# Patient Record
Sex: Male | Born: 1980 | Race: Black or African American | Hispanic: No | Smoking: Current some day smoker
Health system: Southern US, Community
[De-identification: ages and names within clinical notes are randomized; demographics above are authoritative.]

## PROBLEM LIST (undated history)

## (undated) DIAGNOSIS — S83282A Other tear of lateral meniscus, current injury, left knee, initial encounter: Secondary | ICD-10-CM

## (undated) DIAGNOSIS — I10 Essential (primary) hypertension: Secondary | ICD-10-CM

## (undated) DIAGNOSIS — F32A Depression, unspecified: Secondary | ICD-10-CM

## (undated) DIAGNOSIS — I209 Angina pectoris, unspecified: Secondary | ICD-10-CM

## (undated) DIAGNOSIS — F329 Major depressive disorder, single episode, unspecified: Secondary | ICD-10-CM

## (undated) DIAGNOSIS — F419 Anxiety disorder, unspecified: Secondary | ICD-10-CM

## (undated) HISTORY — DX: Anxiety disorder, unspecified: F41.9

## (undated) HISTORY — DX: Depression, unspecified: F32.A

---
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
## 2013-09-20 HISTORY — PX: WRIST SURGERY: SHX841

## 2015-11-25 ENCOUNTER — Ambulatory Visit
Admission: RE | Admit: 2015-11-25 | Discharge: 2015-11-25 | Disposition: A | Discharge status: Home / Self Care | Source: Ambulatory Visit | Attending: Family Medicine | Admitting: Family Medicine

## 2015-11-25 ENCOUNTER — Other Ambulatory Visit: Payer: Self-pay | Admitting: Family Medicine

## 2015-11-25 DIAGNOSIS — M545 Low back pain: Secondary | ICD-10-CM

## 2016-06-09 ENCOUNTER — Ambulatory Visit
Admission: RE | Admit: 2016-06-09 | Discharge: 2016-06-09 | Disposition: A | Discharge status: Home / Self Care | Source: Ambulatory Visit | Attending: Family Medicine | Admitting: Family Medicine

## 2016-06-09 ENCOUNTER — Other Ambulatory Visit: Payer: Self-pay | Admitting: Family Medicine

## 2016-06-09 DIAGNOSIS — M25571 Pain in right ankle and joints of right foot: Secondary | ICD-10-CM

## 2016-06-09 DIAGNOSIS — M25561 Pain in right knee: Secondary | ICD-10-CM

## 2016-06-09 DIAGNOSIS — T1490XA Injury, unspecified, initial encounter: Secondary | ICD-10-CM

## 2019-07-27 ENCOUNTER — Other Ambulatory Visit: Payer: Self-pay

## 2019-07-27 ENCOUNTER — Encounter: Payer: Self-pay | Admitting: Cardiology

## 2019-07-27 ENCOUNTER — Ambulatory Visit (INDEPENDENT_AMBULATORY_CARE_PROVIDER_SITE_OTHER): Admitting: Cardiology

## 2019-07-27 VITALS — BP 141/93 | HR 58 | Temp 97.0°F | Ht 74.0 in | Wt 258.0 lb

## 2019-07-27 DIAGNOSIS — Z8249 Family history of ischemic heart disease and other diseases of the circulatory system: Secondary | ICD-10-CM | POA: Diagnosis not present

## 2019-07-27 DIAGNOSIS — R030 Elevated blood-pressure reading, without diagnosis of hypertension: Secondary | ICD-10-CM

## 2019-07-27 DIAGNOSIS — I209 Angina pectoris, unspecified: Secondary | ICD-10-CM | POA: Diagnosis not present

## 2019-07-27 MED ORDER — ROSUVASTATIN CALCIUM 10 MG PO TABS: 10.0000 mg | ORAL_TABLET | Freq: Every day | ORAL | 1 refills | Status: DC

## 2019-07-27 MED ORDER — NITROGLYCERIN 0.4 MG SL SUBL: 0.4000 mg | SUBLINGUAL_TABLET | SUBLINGUAL | 1 refills | Status: DC | PRN

## 2019-07-27 MED ORDER — AMLODIPINE BESYLATE 5 MG PO TABS: 5.0000 mg | ORAL_TABLET | Freq: Every day | ORAL | 2 refills | Status: DC

## 2019-07-27 NOTE — Progress Notes (Signed)
Primary Physician/Referring:  Lin Landsman, MD  Patient ID: Nathan Rosales, male    DOB: Nov 17, 1980, 38 y.o.   MRN: [id number]  Chief Complaint  Patient presents with  . Chest Pain  . Tachycardia  . New Patient (Initial Visit)   HPI:    HPI: Nathan Rosales  is a 38 y.o. with anxiety/depression, family history of heart disease in his father who had CABG x 3 in his late 15's, referred to [place] by Dr. Ayesha Rumpf for evaluation of chest pain, shortness of breath, and dizziness that occurred during intercourse.   He reports that about a month ago, during sex, he suddenly developed chest heaviness and near syncope. States that the episode made him drop to his knees, but he did not actually pass out. He has not had recurrence; however, he has not been pushing himself. He did have some slight chest heaviness while on the elliptical 1 day ago that improved with resting.   No history of hypertension and hyperlipidemia.  Smokes 2 cigars a week. Drinks alcohol socially. No illicit drug use. He is a former Magazine features editor, and now works as a Human resources officer.   Past Medical History:  Diagnosis Date  . Anxiety   . Depression    Past Surgical History:  Procedure Laterality Date  . WRIST SURGERY  2015   Social History   Socioeconomic History  . Marital status: Divorced    Spouse name: Not on file  . Number of children: 2  . Years of education: Not on file  . Highest education level: Not on file  Occupational History  . Not on file  Social Needs  . Financial resource strain: Not on file  . Food insecurity    Worry: Not on file    Inability: Not on file  . Transportation needs    Medical: Not on file    Non-medical: Not on file  Tobacco Use  . Smoking status: Current Some Day Smoker  . Smokeless tobacco: Never Used  Substance and Sexual Activity  . Alcohol use: Yes  . Drug use: Never  . Sexual activity: Not on file  Lifestyle  . Physical activity    Days per week: Not on file    Minutes per  session: Not on file  . Stress: Not on file  Relationships  . Social Herbalist on phone: Not on file    Gets together: Not on file    Attends religious service: Not on file    Active member of club or organization: Not on file    Attends meetings of clubs or organizations: Not on file    Relationship status: Not on file  . Intimate partner violence    Fear of current or ex partner: Not on file    Emotionally abused: Not on file    Physically abused: Not on file    Forced sexual activity: Not on file  Other Topics Concern  . Not on file  Social History Narrative  . Not on file   ROS  Review of Systems  Constitution: Negative for decreased appetite, malaise/fatigue, weight gain and weight loss.  Eyes: Negative for visual disturbance.  Cardiovascular: Positive for chest pain. Negative for claudication, dyspnea on exertion, leg swelling, orthopnea, palpitations and syncope.  Respiratory: Negative for hemoptysis and wheezing.   Endocrine: Negative for cold intolerance and heat intolerance.  Hematologic/Lymphatic: Does not bruise/bleed easily.  Skin: Negative for nail changes.  Musculoskeletal: Negative for muscle weakness and myalgias.  Gastrointestinal: Negative for abdominal pain, change in bowel habit, nausea and vomiting.  Neurological: Negative for difficulty with concentration, dizziness, focal weakness and headaches.  Psychiatric/Behavioral: Negative for altered mental status and suicidal ideas.  All other systems reviewed and are negative.  Objective  Blood pressure (!) 141/93, pulse (!) 58, temperature (!) 97 F (36.1 C), height 6\' 2"  (1.88 m), weight 258 lb (117 kg), SpO2 97 %. Body mass index is 33.13 kg/m.   Physical Exam  Constitutional: He is oriented to person, place, and time. Vital signs are normal. He appears well-developed and well-nourished.  HENT:  Head: Normocephalic and atraumatic.  Neck: Normal range of motion.  Cardiovascular: Normal rate,  regular rhythm, normal heart sounds and intact distal pulses.  Pulmonary/Chest: Effort normal and breath sounds normal. No accessory muscle usage. No respiratory distress.  Abdominal: Soft. Bowel sounds are normal.  Musculoskeletal: Normal range of motion.  Neurological: He is alert and oriented to person, place, and time.  Skin: Skin is warm and dry.  Vitals reviewed.  Radiology: No results found.  Laboratory examination:   No results for input(s): NA, K, CL, CO2, GLUCOSE, BUN, CREATININE, CALCIUM, GFRNONAA, GFRAA in the last 8760 hours. No flowsheet data found. No flowsheet data found. Lipid Panel  No results found for: CHOL, TRIG, HDL, CHOLHDL, VLDL, LDLCALC, LDLDIRECT HEMOGLOBIN A1C No results found for: HGBA1C, MPG TSH No results for input(s): TSH in the last 8760 hours. Medications   Current Outpatient Medications  Medication Instructions  . amLODipine (NORVASC) 5 mg, Oral, Daily  . escitalopram (LEXAPRO) 10 MG tablet 1 tablet, Oral, Daily PRN  . ibuprofen (ADVIL) 800 MG tablet 1 tablet, Oral, As needed  . nitroGLYCERIN (NITROSTAT) 0.4 mg, Sublingual, Every 5 min PRN  . rosuvastatin (CRESTOR) 10 mg, Oral, Daily  . zolpidem (AMBIEN) 10 MG tablet 1 tablet, Oral, Daily    Cardiac Studies:     Assessment     ICD-10-CM   1. Angina pectoris (HCC)  I20.9 EKG 12-Lead    Comprehensive Metabolic Panel (CMET)    CBC    Lipid Profile    PCV ECHOCARDIOGRAM COMPLETE  2. Elevated blood-pressure reading without diagnosis of hypertension  R03.0 PCV ECHOCARDIOGRAM COMPLETE  3. Family history of heart disease  Z82.49   4. Chest pain due to myocardial ischemia, unspecified ischemic chest pain type  I25.9 CT COROARY FRACTIONAL FLOW RESERVE (FFR)    CT CORONARY FRACTIONAL FLOW RESERVE FLUID ANALYSIS    CT CORONARY MORPH W/CTA COR W/SCORE W/CA W/CM &/OR WO/CM    EKG 07/27/2019: Normal sinus rhythm at 64 bpm, normal axis, PRWP cannot exclude anterior infarct old. No evidence of  ischemia.  Recommendations:   Patient symptoms are concerning for angina.  He has family history of heart disease.  Will obtain coronary CTA for further evaluation.  His blood pressure is also elevated today, but is without history of hypertension.  Will obtain echocardiogram to exclude any structural abnormalities.  He has not had any recent labs, will obtain CMP, CBC, and lipids.  We will start Crestor 10 mg daily and make further recommendations depending upon his lipid panel results.  We will also start him on amlodipine 5 mg daily given his symptoms of angina and elevated blood pressure.  I have also given him prescription for nitroglycerin and advised how to use in case he has any recurrence of chest pain.  Encouraged him to take it easy until he can have further evaluation.  We will see him back  after the test for further recommendations and reevaluation.   *I have discussed this case with Dr. Jacinto HalimGanji and he personally examined the patient and participated in formulating the plan.*  Toniann FailAshton Haynes Kelley, MSN, APRN, FNP-C [facility] Office: [phone] Fax: [phone]

## 2019-07-31 LAB — CBC
Hematocrit: 45.4 % (ref 37.5–51.0)
Hemoglobin: 15.4 g/dL (ref 13.0–17.7)
MCH: 28.5 pg (ref 26.6–33.0)
MCHC: 33.9 g/dL (ref 31.5–35.7)
MCV: 84 fL (ref 79–97)
Platelets: 192 10*3/uL (ref 150–450)
RBC: 5.41 x10E6/uL (ref 4.14–5.80)
RDW: 13.8 % (ref 11.6–15.4)
WBC: 3.7 10*3/uL (ref 3.4–10.8)

## 2019-07-31 LAB — LIPID PANEL
Chol/HDL Ratio: 3.8 ratio (ref 0.0–5.0)
Cholesterol, Total: 210 mg/dL — ABNORMAL HIGH (ref 100–199)
HDL: 56 mg/dL (ref >39)
LDL Chol Calc (NIH): 138 mg/dL — ABNORMAL HIGH (ref 0–99)
Triglycerides: 92 mg/dL (ref 0–149)
VLDL Cholesterol Cal: 16 mg/dL (ref 5–40)

## 2019-07-31 LAB — COMPREHENSIVE METABOLIC PANEL
ALT: 23 IU/L (ref 0–44)
AST: 20 IU/L (ref 0–40)
Albumin/Globulin Ratio: 1.9 (ref 1.2–2.2)
Albumin: 4.5 g/dL (ref 4.0–5.0)
Alkaline Phosphatase: 60 IU/L (ref 39–117)
BUN/Creatinine Ratio: 6 — ABNORMAL LOW (ref 9–20)
BUN: 7 mg/dL (ref 6–20)
Bilirubin Total: 0.4 mg/dL (ref 0.0–1.2)
CO2: 23 mmol/L (ref 20–29)
Calcium: 9.4 mg/dL (ref 8.7–10.2)
Chloride: 104 mmol/L (ref 96–106)
Creatinine, Ser: 1.24 mg/dL (ref 0.76–1.27)
GFR calc Af Amer: 84 mL/min/{1.73_m2} (ref >59)
GFR calc non Af Amer: 73 mL/min/{1.73_m2} (ref >59)
Globulin, Total: 2.4 g/dL (ref 1.5–4.5)
Glucose: 92 mg/dL (ref 65–99)
Potassium: 4.6 mmol/L (ref 3.5–5.2)
Sodium: 139 mmol/L (ref 134–144)
Total Protein: 6.9 g/dL (ref 6.0–8.5)

## 2019-08-01 ENCOUNTER — Other Ambulatory Visit: Payer: Self-pay

## 2019-08-01 ENCOUNTER — Ambulatory Visit (INDEPENDENT_AMBULATORY_CARE_PROVIDER_SITE_OTHER)

## 2019-08-01 DIAGNOSIS — I209 Angina pectoris, unspecified: Secondary | ICD-10-CM | POA: Diagnosis not present

## 2019-08-01 DIAGNOSIS — R030 Elevated blood-pressure reading, without diagnosis of hypertension: Secondary | ICD-10-CM

## 2019-08-13 ENCOUNTER — Other Ambulatory Visit: Payer: Self-pay

## 2019-08-13 DIAGNOSIS — I209 Angina pectoris, unspecified: Secondary | ICD-10-CM

## 2019-08-13 MED ORDER — NITROGLYCERIN 0.4 MG SL SUBL: 0.4000 mg | SUBLINGUAL_TABLET | SUBLINGUAL | 1 refills | Status: DC | PRN

## 2019-08-23 ENCOUNTER — Encounter: Payer: Self-pay | Admitting: Cardiology

## 2019-08-23 ENCOUNTER — Other Ambulatory Visit: Payer: Self-pay

## 2019-08-23 ENCOUNTER — Ambulatory Visit (INDEPENDENT_AMBULATORY_CARE_PROVIDER_SITE_OTHER): Admitting: Cardiology

## 2019-08-23 VITALS — BP 128/96 | HR 72 | Ht 74.0 in | Wt 264.0 lb

## 2019-08-23 DIAGNOSIS — I208 Other forms of angina pectoris: Secondary | ICD-10-CM | POA: Diagnosis not present

## 2019-08-23 DIAGNOSIS — Z8249 Family history of ischemic heart disease and other diseases of the circulatory system: Secondary | ICD-10-CM

## 2019-08-23 DIAGNOSIS — E782 Mixed hyperlipidemia: Secondary | ICD-10-CM

## 2019-08-23 DIAGNOSIS — I10 Essential (primary) hypertension: Secondary | ICD-10-CM

## 2019-08-23 MED ORDER — METOPROLOL TARTRATE 25 MG PO TABS: 25.0000 mg | ORAL_TABLET | Freq: Two times a day (BID) | ORAL | 2 refills | Status: DC

## 2019-08-23 NOTE — Progress Notes (Signed)
Primary Physician/Referring:  Leilani Ableeese, Betti, MD  Patient ID: Nathan Rosales, male    DOB: [birth year], 38 y.o.   MRN: [id number]  Chief Complaint  Patient presents with  . Chest Pain    test results   . Follow-up   HPI:    HPI: Nathan Rosales  is a 38 y.o. with anxiety/depression, family history of heart disease in his father who had CABG x 3 in his late 3750's, recently evaluated by us for concerning symptoms of angina that occurred during intercourse and with exercise, coronary CTA and echocardiogram were ordered now presents for follow-up.  It does not appear that coronary CTA has been performed.  Blood pressure was noted to be elevated at his last office visit, he was started on amlodipine 5 mg.  He was also started on Crestor 10 mg.  Since last seen by me he has not had any further symptoms of chest pain like before; however, has been avoiding strenuous activities to not have any episodes.  He does mention some mild chest discomfort on occasion with stress sitting resting.  He has not used any nitroglycerin.  He is tolerating medications well.  He is very stressed as he reports his cousin who is in his 7750s recently passed of an MI.  No history of hypertension and hyperlipidemia.  Smokes 2 cigars a week. Drinks alcohol socially. No illicit drug use. He is a former Associate Professorcombat medic, and now works as a Corporate investment bankerrecruiter.   Past Medical History:  Diagnosis Date  . Anxiety   . Depression    Past Surgical History:  Procedure Laterality Date  . WRIST SURGERY  2015   Social History   Socioeconomic History  . Marital status: Divorced    Spouse name: Not on file  . Number of children: 2  . Years of education: Not on file  . Highest education level: Not on file  Occupational History  . Not on file  Social Needs  . Financial resource strain: Not on file  . Food insecurity    Worry: Not on file    Inability: Not on file  . Transportation needs    Medical: Not on file    Non-medical: Not on  file  Tobacco Use  . Smoking status: Current Some Day Smoker  . Smokeless tobacco: Never Used  Substance and Sexual Activity  . Alcohol use: Yes  . Drug use: Never  . Sexual activity: Not on file  Lifestyle  . Physical activity    Days per week: Not on file    Minutes per session: Not on file  . Stress: Not on file  Relationships  . Social Musicianconnections    Talks on phone: Not on file    Gets together: Not on file    Attends religious service: Not on file    Active member of club or organization: Not on file    Attends meetings of clubs or organizations: Not on file    Relationship status: Not on file  . Intimate partner violence    Fear of current or ex partner: Not on file    Emotionally abused: Not on file    Physically abused: Not on file    Forced sexual activity: Not on file  Other Topics Concern  . Not on file  Social History Narrative  . Not on file   ROS  Review of Systems  Constitution: Negative for decreased appetite, malaise/fatigue, weight gain and weight loss.  Eyes: Negative for visual disturbance.  Cardiovascular: Positive for chest pain. Negative for claudication, dyspnea on exertion, leg swelling, orthopnea, palpitations and syncope.  Respiratory: Negative for hemoptysis and wheezing.   Endocrine: Negative for cold intolerance and heat intolerance.  Hematologic/Lymphatic: Does not bruise/bleed easily.  Skin: Negative for nail changes.  Musculoskeletal: Negative for muscle weakness and myalgias.  Gastrointestinal: Negative for abdominal pain, change in bowel habit, nausea and vomiting.  Neurological: Negative for difficulty with concentration, dizziness, focal weakness and headaches.  Psychiatric/Behavioral: Negative for altered mental status and suicidal ideas.  All other systems reviewed and are negative.  Objective  Blood pressure (!) 128/96, pulse 72, height 6\' 2"  (1.88 m), weight 264 lb (119.7 kg), SpO2 99 %. Body mass index is 33.9 kg/m.    Physical Exam  Constitutional: He is oriented to person, place, and time. Vital signs are normal. He appears well-developed and well-nourished.  HENT:  Head: Normocephalic and atraumatic.  Neck: Normal range of motion.  Cardiovascular: Normal rate, regular rhythm, normal heart sounds and intact distal pulses.  Pulmonary/Chest: Effort normal and breath sounds normal. No accessory muscle usage. No respiratory distress.  Abdominal: Soft. Bowel sounds are normal.  Musculoskeletal: Normal range of motion.  Neurological: He is alert and oriented to person, place, and time.  Skin: Skin is warm and dry.  Vitals reviewed.  Radiology: No results found.  Laboratory examination:   Recent Labs    07/30/19 1145  NA 139  K 4.6  CL 104  CO2 23  GLUCOSE 92  BUN 7  CREATININE 1.24  CALCIUM 9.4  GFRNONAA 73  GFRAA 84   CMP Latest Ref Rng & Units 07/30/2019  Glucose 65 - 99 mg/dL 92  BUN 6 - 20 mg/dL 7  Creatinine 0.76 - 1.27 mg/dL 1.24  Sodium 134 - 144 mmol/L 139  Potassium 3.5 - 5.2 mmol/L 4.6  Chloride 96 - 106 mmol/L 104  CO2 20 - 29 mmol/L 23  Calcium 8.7 - 10.2 mg/dL 9.4  Total Protein 6.0 - 8.5 g/dL 6.9  Total Bilirubin 0.0 - 1.2 mg/dL 0.4  Alkaline Phos 39 - 117 IU/L 60  AST 0 - 40 IU/L 20  ALT 0 - 44 IU/L 23   CBC Latest Ref Rng & Units 07/30/2019  WBC 3.4 - 10.8 x10E3/uL 3.7  Hemoglobin 13.0 - 17.7 g/dL 15.4  Hematocrit 37.5 - 51.0 % 45.4  Platelets 150 - 450 x10E3/uL 192   Lipid Panel     Component Value Date/Time   CHOL 210 (H) 07/30/2019 1145   TRIG 92 07/30/2019 1145   HDL 56 07/30/2019 1145   CHOLHDL 3.8 07/30/2019 1145   LDLCALC 138 (H) 07/30/2019 1145   HEMOGLOBIN A1C No results found for: HGBA1C, MPG TSH No results for input(s): TSH in the last 8760 hours. Medications   Current Outpatient Medications  Medication Instructions  . amLODipine (NORVASC) 5 mg, Oral, Daily  . escitalopram (LEXAPRO) 10 MG tablet 1 tablet, Oral, Daily PRN  . ibuprofen  (ADVIL) 800 MG tablet 1 tablet, Oral, As needed  . metoprolol tartrate (LOPRESSOR) 25 mg, Oral, 2 times daily  . nitroGLYCERIN (NITROSTAT) 0.4 mg, Sublingual, Every 5 min PRN  . rosuvastatin (CRESTOR) 10 mg, Oral, Daily  . zolpidem (AMBIEN) 10 MG tablet 1 tablet, Oral, Daily    Cardiac Studies:   Echocardiogram 08/01/2019: Left ventricle cavity is normal in size. Mild concentric hypertrophy of the left ventricle. Normal LV systolic function with visual EF 50-55%. Normal global wall motion. Normal diastolic filling pattern. Calculated EF  50%. Mild to moderate mitral regurgitation. Mild tricuspid regurgitation.  No evidence of pulmonary hypertension.   Assessment     ICD-10-CM   1. Stable angina pectoris (HCC)  I20.8 CT CORONARY MORPH W/CTA COR W/SCORE W/CA W/CM &/OR WO/CM    CT CORONARY FRACTIONAL FLOW RESERVE FLUID ANALYSIS    CT COROARY FRACTIONAL FLOW RESERVE (FFR)  2. Essential hypertension  I10   3. Family history of heart disease  Z82.49     EKG 07/27/2019: Normal sinus rhythm at 64 bpm, normal axis, PRWP cannot exclude anterior infarct old. No evidence of ischemia.  Recommendations:   I discussed recent echocardiogram results, low normal LVEF.  Has mild LVH, would recommend aggressive blood pressure control.  Coronary CTA was ordered after his last office visit due to concerning symptoms of angina; however, has not yet been scheduled.  We will follow up on this and have this scheduled in the next few weeks.  Since last seen by me, he has not had any symptoms of angina like before, does mention some mild chest discomfort on occasion, but nothing severe requiring any nitroglycerin.  He has also been avoiding strenuous activities to hopefully prevent any episodes.  His blood pressure continues to be markedly elevated.  I will add metoprolol tartrate 25 mg twice daily.  Continue with amlodipine.  I have encouraged him to continue to take it easy until he can have further evaluation.   Continue with Crestor in view of hyperlipidemia and family history of CAD.  I will see him back in the next few weeks after coronary CTA for follow-up.  Toniann Fail, MSN, APRN, FNP-C [facility] Office: [phone] Fax: [phone]

## 2019-08-24 ENCOUNTER — Ambulatory Visit: Payer: Self-pay | Admitting: Cardiology

## 2019-08-29 ENCOUNTER — Telehealth (HOSPITAL_COMMUNITY): Payer: Self-pay | Admitting: Emergency Medicine

## 2019-08-29 NOTE — Telephone Encounter (Signed)
Left message on voicemail with name and callback number Brooks Kinnan RN Navigator Cardiac Imaging [facility] [phone] Office [phone] Cell  

## 2019-08-30 ENCOUNTER — Ambulatory Visit (HOSPITAL_COMMUNITY)
Admission: RE | Admit: 2019-08-30 | Discharge: 2019-08-30 | Disposition: A | Discharge status: Home / Self Care | Source: Ambulatory Visit | Attending: Cardiology | Admitting: Cardiology

## 2019-08-30 ENCOUNTER — Encounter: Admitting: *Deleted

## 2019-08-30 ENCOUNTER — Other Ambulatory Visit: Payer: Self-pay

## 2019-08-30 DIAGNOSIS — I209 Angina pectoris, unspecified: Secondary | ICD-10-CM

## 2019-08-30 DIAGNOSIS — Z006 Encounter for examination for normal comparison and control in clinical research program: Secondary | ICD-10-CM

## 2019-08-30 MED ORDER — NITROGLYCERIN 0.4 MG SL SUBL
SUBLINGUAL_TABLET | SUBLINGUAL | Status: AC
  Filled 2019-08-30: qty 2

## 2019-08-30 MED ORDER — NITROGLYCERIN 0.4 MG SL SUBL
0.8000 mg | SUBLINGUAL_TABLET | Freq: Once | SUBLINGUAL | Status: AC
  Administered 2019-08-30: 16:00:00 0.8 mg via SUBLINGUAL

## 2019-08-30 MED ORDER — IOHEXOL 350 MG/ML SOLN
100.0000 mL | Freq: Once | INTRAVENOUS | Status: AC | PRN
  Administered 2019-08-30: 100 mL via INTRAVENOUS

## 2019-08-30 NOTE — Research (Signed)
CADFEM Informed Consent                  Subject Name:   Nathan Rosales   Subject met inclusion and exclusion criteria.  The informed consent form, study requirements and expectations were reviewed with the subject and questions and concerns were addressed prior to the signing of the consent form.  The subject verbalized understanding of the trial requirements.  The subject agreed to participate in the CADFEM trial and signed the informed consent.  The informed consent was obtained prior to performance of any protocol-specific procedures for the subject.  A copy of the signed informed consent was given to the subject and a copy was placed in the subject's medical record.   [place] Greogory Cornette, Research Assistant  08/30/2019 14:46

## 2019-09-11 ENCOUNTER — Telehealth: Payer: Self-pay

## 2019-09-17 ENCOUNTER — Encounter: Payer: Self-pay | Admitting: Cardiology

## 2019-09-18 ENCOUNTER — Other Ambulatory Visit: Payer: Self-pay

## 2019-09-18 MED ORDER — ROSUVASTATIN CALCIUM 10 MG PO TABS: 10.0000 mg | ORAL_TABLET | Freq: Every day | ORAL | 1 refills | Status: DC

## 2019-09-19 ENCOUNTER — Encounter: Payer: Self-pay | Admitting: Cardiology

## 2019-09-20 ENCOUNTER — Ambulatory Visit: Admitting: Cardiology

## 2019-09-20 NOTE — Progress Notes (Deleted)
Primary Physician/Referring:  Leilani Ableeese, Betti, MD  Patient ID: Nathan Rosales, male    DOB: [birth year], 38 y.o.   MRN: [id number]  No chief complaint on file.  HPI:    HPI: Nathan Rosales  is a 38 y.o. with anxiety/depression, family history of heart disease in his father who had CABG x 3 in his late 4750's, recently evaluated by us for concerning symptoms of angina that occurred during intercourse and with exercise, coronary CTA and echocardiogram were ordered now presents for follow-up.  It does not appear that coronary CTA has been performed.  Blood pressure was noted to be elevated at his last office visit, he was started on amlodipine 5 mg.  He was also started on Crestor 10 mg.  Since last seen by me he has not had any further symptoms of chest pain like before; however, has been avoiding strenuous activities to not have any episodes.  He does mention some mild chest discomfort on occasion with stress sitting resting.  He has not used any nitroglycerin.  He is tolerating medications well.  He is very stressed as he reports his cousin who is in his 3850s recently passed of an MI.  No history of hypertension and hyperlipidemia.  Smokes 2 cigars a week. Drinks alcohol socially. No illicit drug use. He is a former Associate Professorcombat medic, and now works as a Corporate investment bankerrecruiter.   Past Medical History:  Diagnosis Date  . Anxiety   . Depression    Past Surgical History:  Procedure Laterality Date  . WRIST SURGERY  2015   Social History   Socioeconomic History  . Marital status: Divorced    Spouse name: Not on file  . Number of children: 2  . Years of education: Not on file  . Highest education level: Not on file  Occupational History  . Not on file  Tobacco Use  . Smoking status: Current Some Day Smoker  . Smokeless tobacco: Never Used  Substance and Sexual Activity  . Alcohol use: Yes  . Drug use: Never  . Sexual activity: Not on file  Other Topics Concern  . Not on file  Social History  Narrative  . Not on file   Social Determinants of Health   Financial Resource Strain:   . Difficulty of Paying Living Expenses: Not on file  Food Insecurity:   . Worried About Programme researcher, broadcasting/film/videounning Out of Food in the Last Year: Not on file  . Ran Out of Food in the Last Year: Not on file  Transportation Needs:   . Lack of Transportation (Medical): Not on file  . Lack of Transportation (Non-Medical): Not on file  Physical Activity:   . Days of Exercise per Week: Not on file  . Minutes of Exercise per Session: Not on file  Stress:   . Feeling of Stress : Not on file  Social Connections:   . Frequency of Communication with Friends and Family: Not on file  . Frequency of Social Gatherings with Friends and Family: Not on file  . Attends Religious Services: Not on file  . Active Member of Clubs or Organizations: Not on file  . Attends BankerClub or Organization Meetings: Not on file  . Marital Status: Not on file  Intimate Partner Violence:   . Fear of Current or Ex-Partner: Not on file  . Emotionally Abused: Not on file  . Physically Abused: Not on file  . Sexually Abused: Not on file   ROS  Review of Systems  Constitution: Negative for  decreased appetite, malaise/fatigue, weight gain and weight loss.  Eyes: Negative for visual disturbance.  Cardiovascular: Positive for chest pain. Negative for claudication, dyspnea on exertion, leg swelling, orthopnea, palpitations and syncope.  Respiratory: Negative for hemoptysis and wheezing.   Endocrine: Negative for cold intolerance and heat intolerance.  Hematologic/Lymphatic: Does not bruise/bleed easily.  Skin: Negative for nail changes.  Musculoskeletal: Negative for muscle weakness and myalgias.  Gastrointestinal: Negative for abdominal pain, change in bowel habit, nausea and vomiting.  Neurological: Negative for difficulty with concentration, dizziness, focal weakness and headaches.  Psychiatric/Behavioral: Negative for altered mental status and  suicidal ideas.  All other systems reviewed and are negative.  Objective  There were no vitals taken for this visit. There is no height or weight on file to calculate BMI.   Physical Exam  Constitutional: He is oriented to person, place, and time. Vital signs are normal. He appears well-developed and well-nourished.  HENT:  Head: Normocephalic and atraumatic.  Cardiovascular: Normal rate, regular rhythm, normal heart sounds and intact distal pulses.  Pulmonary/Chest: Effort normal and breath sounds normal. No accessory muscle usage. No respiratory distress.  Abdominal: Soft. Bowel sounds are normal.  Musculoskeletal:        General: Normal range of motion.     Cervical back: Normal range of motion.  Neurological: He is alert and oriented to person, place, and time.  Skin: Skin is warm and dry.  Vitals reviewed.  Radiology: No results found.  Laboratory examination:   Recent Labs    07/30/19 1145  NA 139  K 4.6  CL 104  CO2 23  GLUCOSE 92  BUN 7  CREATININE 1.24  CALCIUM 9.4  GFRNONAA 73  GFRAA 84   CMP Latest Ref Rng & Units 07/30/2019  Glucose 65 - 99 mg/dL 92  BUN 6 - 20 mg/dL 7  Creatinine 0.76 - 1.27 mg/dL 1.24  Sodium 134 - 144 mmol/L 139  Potassium 3.5 - 5.2 mmol/L 4.6  Chloride 96 - 106 mmol/L 104  CO2 20 - 29 mmol/L 23  Calcium 8.7 - 10.2 mg/dL 9.4  Total Protein 6.0 - 8.5 g/dL 6.9  Total Bilirubin 0.0 - 1.2 mg/dL 0.4  Alkaline Phos 39 - 117 IU/L 60  AST 0 - 40 IU/L 20  ALT 0 - 44 IU/L 23   CBC Latest Ref Rng & Units 07/30/2019  WBC 3.4 - 10.8 x10E3/uL 3.7  Hemoglobin 13.0 - 17.7 g/dL 15.4  Hematocrit 37.5 - 51.0 % 45.4  Platelets 150 - 450 x10E3/uL 192   Lipid Panel     Component Value Date/Time   CHOL 210 (H) 07/30/2019 1145   TRIG 92 07/30/2019 1145   HDL 56 07/30/2019 1145   CHOLHDL 3.8 07/30/2019 1145   LDLCALC 138 (H) 07/30/2019 1145   HEMOGLOBIN A1C No results found for: HGBA1C, MPG TSH No results for input(s): TSH in the last 8760  hours. Medications   Current Outpatient Medications  Medication Instructions  . amLODipine (NORVASC) 5 mg, Oral, Daily  . escitalopram (LEXAPRO) 10 MG tablet 1 tablet, Oral, Daily PRN  . ibuprofen (ADVIL) 800 MG tablet 1 tablet, Oral, As needed  . metoprolol tartrate (LOPRESSOR) 25 mg, Oral, 2 times daily  . nitroGLYCERIN (NITROSTAT) 0.4 mg, Sublingual, Every 5 min PRN  . rosuvastatin (CRESTOR) 10 mg, Oral, Daily  . zolpidem (AMBIEN) 10 MG tablet 1 tablet, Oral, Daily    Cardiac Studies:   Echocardiogram 08/01/2019: Left ventricle cavity is normal in size. Mild concentric hypertrophy of  the left ventricle. Normal LV systolic function with visual EF 50-55%. Normal global wall motion. Normal diastolic filling pattern. Calculated EF 50%. Mild to moderate mitral regurgitation. Mild tricuspid regurgitation.  No evidence of pulmonary hypertension.   Assessment   No diagnosis found.  EKG 07/27/2019: Normal sinus rhythm at 64 bpm, normal axis, PRWP cannot exclude anterior infarct old. No evidence of ischemia.  Recommendations:   I discussed recent echocardiogram results, low normal LVEF.  Has mild LVH, would recommend aggressive blood pressure control.  Coronary CTA was ordered after his last office visit due to concerning symptoms of angina; however, has not yet been scheduled.  We will follow up on this and have this scheduled in the next few weeks.  Since last seen by me, he has not had any symptoms of angina like before, does mention some mild chest discomfort on occasion, but nothing severe requiring any nitroglycerin.  He has also been avoiding strenuous activities to hopefully prevent any episodes.  His blood pressure continues to be markedly elevated.  I will add metoprolol tartrate 25 mg twice daily.  Continue with amlodipine.  I have encouraged him to continue to take it easy until he can have further evaluation.  Continue with Crestor in view of hyperlipidemia and family history of  CAD.  I will see him back in the next few weeks after coronary CTA for follow-up.  Toniann Fail, MSN, APRN, FNP-C [facility] Office: [phone] Fax: [phone]

## 2019-09-24 NOTE — Progress Notes (Signed)
Called and spoke w/ Nathan Rosales, OR scheduler for Dr Aundria Rud, and requested cardiac clearance to be faxed.

## 2019-09-25 ENCOUNTER — Other Ambulatory Visit: Payer: Self-pay

## 2019-09-25 ENCOUNTER — Other Ambulatory Visit (HOSPITAL_COMMUNITY)
Admission: RE | Admit: 2019-09-25 | Discharge: 2019-09-25 | Disposition: A | Discharge status: Home / Self Care | Source: Ambulatory Visit | Attending: Orthopedic Surgery | Admitting: Orthopedic Surgery

## 2019-09-25 ENCOUNTER — Encounter (HOSPITAL_BASED_OUTPATIENT_CLINIC_OR_DEPARTMENT_OTHER): Payer: Self-pay | Admitting: Orthopedic Surgery

## 2019-09-25 DIAGNOSIS — Z20822 Contact with and (suspected) exposure to covid-19: Secondary | ICD-10-CM | POA: Insufficient documentation

## 2019-09-25 DIAGNOSIS — Z01812 Encounter for preprocedural laboratory examination: Secondary | ICD-10-CM | POA: Diagnosis present

## 2019-09-25 NOTE — Progress Notes (Signed)
Spoke w/ via phone for pre-op interview---March Lab needs dos----  I stat 8            Lab results------ COVID test ------09-25-2019 Arrive at -------1030 am 09-28-2019 NPO after ------midnight Medications to take morning of surgery -----amlodipine, rosucastatin Diabetic medication -----n/a Patient Special Instructions ----- Pre-Op special Istructions ----- Patient verbalized understanding of instructions that were given at this phone interview. Patient denies shortness of breath, chest pain, fever, cough a this phone interview.  Anesthesia Review:  PCP: dr Leilani Able Cardiologist : cardiac clearance dr Jacinto Halim 09-17-2019 on chart, lov ashton kelly no 08-23-2019 chart/epic,  Coronary ct 08-30-2019 chart/epic Chest x-ray :none EKG :07-27-2019 chart/epic Echo :08-01-19 chart/epic Cardiac Cath : none Sleep Study/ CPAP :none Fasting Blood Sugar :      / Checks Blood Sugar -- times a day:  n/a Blood Thinner/ Instructions /Last Dose:none ASA / Instructions/ Last Dose : none  Patient denies shortness of breath, chest pain, fever, and cough at this phone interview.

## 2019-09-26 NOTE — Progress Notes (Signed)
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 / [facility]   Surgeons: Yolonda Kida, MD      DISCUSSION:38 y.o. current some day smoker with h/o anxiety, depression, HTN, left knee lateral meniscus tear scheduled for above procedure 09/28/2019 with Dr. Duwayne Heck.   Cardiac clearance on chart.  Last seen by cardiology 08/23/2019.  No CAD on coronary CT 08/30/2019,  Echo 08/01/2019 with EF 50-55%.   Anticipate pt can proceed with planned procedure barring acute status change.   VS: Ht 6\' 2"  (1.88 m)   Wt 115.7 kg   BMI 32.74 kg/m   PROVIDERS: , MD is PCP   Leilani Able, MD is Cardiologist  LABS: labs DOS (all labs ordered are listed, but only abnormal results are displayed)  Labs Reviewed - No data to display   IMAGES: CT Coronary 08/30/2019 IMPRESSION: 1. Coronary calcium score of 0. This was 0 percentile for age and sex matched control.  2. Normal coronary origin with right dominance.  3. CAD-RADS 0. No evidence of CAD (0%). Consider non-atherosclerotic causes of chest pain.  4. Moderately dilated pulmonary artery measuring 35 mm suggestive of pulmonary hypertension. There is no ASD or VSD.  5.  Mild concentric left ventricular hypertrophy.  EKG: 07/27/2019 Normal sinus rhythm at 64 bpm, normal axis, PRWP cannot exclude anterior infarct old. No evidence of ischemia.  CV: Echocardiogram 08/01/2019: Left ventricle cavity is normal in size. Mild concentric hypertrophy of the left ventricle. Normal LV systolic function with visual EF 50-55%. Normal global wall motion. Normal diastolic filling pattern. Calculated EF 50%. Mild to moderate mitral  regurgitation. Mild tricuspid regurgitation.  No evidence of pulmonary hypertension. Past Medical History:  Diagnosis Date  . Acute lateral meniscus tear of left knee   . Anginal pain (HCC)   . Anxiety   . Depression   . Hypertension     Past Surgical History:  Procedure Laterality Date  . WRIST SURGERY Right 2015    MEDICATIONS: No current facility-administered medications for this encounter.   2016 amLODipine (NORVASC) 5 MG tablet  . escitalopram (LEXAPRO) 10 MG tablet  . ibuprofen (ADVIL) 800 MG tablet  . metoprolol tartrate (LOPRESSOR) 25 MG tablet  . nitroGLYCERIN (NITROSTAT) 0.4 MG SL tablet  . rosuvastatin (CRESTOR) 10 MG tablet  . zolpidem (AMBIEN) 10 MG tablet   Marland Kitchen, PA-C [facility] Pre-Surgical Testing [phone] @TODAY @ 11:01 AM

## 2019-09-26 NOTE — Anesthesia Preprocedure Evaluation (Addendum)
Anesthesia Evaluation  Patient identified by MRN, date of birth, ID band Patient awake    Reviewed: Allergy & Precautions, NPO status , Patient's Chart, lab work & pertinent test results, reviewed documented beta blocker date and time   History of Anesthesia Complications Negative for: history of anesthetic complications  Airway Mallampati: II  TM Distance: >3 FB Neck ROM: Full   Comment: Narrow palate Dental  (+) Teeth Intact   Pulmonary Current Smoker and Patient abstained from smoking.,    Pulmonary exam normal        Cardiovascular hypertension, Pt. on medications and Pt. on home beta blockers Normal cardiovascular exam     Neuro/Psych PSYCHIATRIC DISORDERS Anxiety Depression negative neurological ROS     GI/Hepatic negative GI ROS, Neg liver ROS,   Endo/Other  negative endocrine ROS  Renal/GU negative Renal ROS  negative genitourinary   Musculoskeletal negative musculoskeletal ROS (+)   Abdominal   Peds  Hematology negative hematology ROS (+)   Anesthesia Other Findings   Reproductive/Obstetrics                            Anesthesia Physical Anesthesia Plan  ASA: II  Anesthesia Plan: General   Post-op Pain Management:    Induction: Intravenous  PONV Risk Score and Plan: 1 and Ondansetron, Dexamethasone, Treatment may vary due to age or medical condition and Midazolam  Airway Management Planned: LMA  Additional Equipment: None  Intra-op Plan:   Post-operative Plan: Extubation in OR  Informed Consent: I have reviewed the patients History and Physical, chart, labs and discussed the procedure including the risks, benefits and alternatives for the proposed anesthesia with the patient or authorized representative who has indicated his/her understanding and acceptance.     Dental advisory given  Plan Discussed with:   Anesthesia Plan Comments: (See PAT note 09/26/2019,  Jodell Cipro, PA-C)       Anesthesia Quick Evaluation

## 2019-09-27 ENCOUNTER — Other Ambulatory Visit: Payer: Self-pay

## 2019-09-27 ENCOUNTER — Ambulatory Visit (INDEPENDENT_AMBULATORY_CARE_PROVIDER_SITE_OTHER): Admitting: Cardiology

## 2019-09-27 ENCOUNTER — Encounter: Payer: Self-pay | Admitting: Cardiology

## 2019-09-27 VITALS — BP 115/77 | HR 85 | Temp 93.9°F | Ht 74.0 in | Wt 255.0 lb

## 2019-09-27 DIAGNOSIS — E782 Mixed hyperlipidemia: Secondary | ICD-10-CM | POA: Diagnosis not present

## 2019-09-27 DIAGNOSIS — I10 Essential (primary) hypertension: Secondary | ICD-10-CM | POA: Diagnosis not present

## 2019-09-27 DIAGNOSIS — Z8249 Family history of ischemic heart disease and other diseases of the circulatory system: Secondary | ICD-10-CM | POA: Diagnosis not present

## 2019-09-27 DIAGNOSIS — Z01810 Encounter for preprocedural cardiovascular examination: Secondary | ICD-10-CM

## 2019-09-27 DIAGNOSIS — I209 Angina pectoris, unspecified: Secondary | ICD-10-CM

## 2019-09-27 LAB — NOVEL CORONAVIRUS, NAA (HOSP ORDER, SEND-OUT TO REF LAB; TAT 18-24 HRS): SARS-CoV-2, NAA: NOT DETECTED

## 2019-09-27 MED ORDER — NITROGLYCERIN 0.4 MG SL SUBL: 0.4000 mg | SUBLINGUAL_TABLET | SUBLINGUAL | 0 refills | Status: DC | PRN

## 2019-09-27 NOTE — Progress Notes (Signed)
Primary Physician/Referring:  Lin Landsman, MD  Patient ID: Nathan Rosales, male    DOB: 1980/12/04, 39 y.o.   MRN: [id number]  Chief Complaint  Patient presents with  . Hypertension  . Results  . Follow-up    4wk   HPI:    HPI: Nathan Rosales  is a 39 y.o. with anxiety/depression, family history of heart disease in his father who had CABG x 3 in his late 30's, recently evaluated by [place] for concerning symptoms of angina that occurred during intercourse and with exercise. Echocardiogram 08/01/19 showed low normal LVEF. He has recently underwent coronary CTA and now presents to discuss results.   He was started on metoprolol at his last visit, that he tolerated well, but states that his 70 year old son has accidentally thrown his prescription away. He states that his blood pressure has been well controlled at home without the metoprolol. He has not had any further chest pain. He is having surgery tomorrow (01/08) with left knee arthroscopic partial lateral meniscectomy with patella chondroplasty. Surgical clearance has been sent.   He is very stressed as he reports his cousin who is in his 85s recently passed of an MI.  No history of hypertension and hyperlipidemia.  Smokes 2 cigars a week. Drinks alcohol socially. No illicit drug use. He is a former Magazine features editor, and now works as a Human resources officer.   Past Medical History:  Diagnosis Date  . Acute lateral meniscus tear of left knee   . Anginal pain ([place])   . Anxiety   . Depression   . Hypertension    Past Surgical History:  Procedure Laterality Date  . WRIST SURGERY Right 2015   Social History   Socioeconomic History  . Marital status: Divorced    Spouse name: Not on file  . Number of children: 2  . Years of education: Not on file  . Highest education level: Not on file  Occupational History  . Not on file  Tobacco Use  . Smoking status: Current Some Day Smoker    Types: Cigars  . Smokeless tobacco: Never Used  Substance and  Sexual Activity  . Alcohol use: Yes    Comment: occ  . Drug use: Never  . Sexual activity: Not on file  Other Topics Concern  . Not on file  Social History Narrative  . Not on file   Social Determinants of Health   Financial Resource Strain:   . Difficulty of Paying Living Expenses: Not on file  Food Insecurity:   . Worried About Charity fundraiser in the Last Year: Not on file  . Ran Out of Food in the Last Year: Not on file  Transportation Needs:   . Lack of Transportation (Medical): Not on file  . Lack of Transportation (Non-Medical): Not on file  Physical Activity:   . Days of Exercise per Week: Not on file  . Minutes of Exercise per Session: Not on file  Stress:   . Feeling of Stress : Not on file  Social Connections:   . Frequency of Communication with Friends and Family: Not on file  . Frequency of Social Gatherings with Friends and Family: Not on file  . Attends Religious Services: Not on file  . Active Member of Clubs or Organizations: Not on file  . Attends Archivist Meetings: Not on file  . Marital Status: Not on file  Intimate Partner Violence:   . Fear of Current or Ex-Partner: Not on file  .  Emotionally Abused: Not on file  . Physically Abused: Not on file  . Sexually Abused: Not on file   ROS  Review of Systems  Constitution: Negative for decreased appetite, malaise/fatigue, weight gain and weight loss.  Eyes: Negative for visual disturbance.  Cardiovascular: Positive for chest pain. Negative for claudication, dyspnea on exertion, leg swelling, orthopnea, palpitations and syncope.  Respiratory: Negative for hemoptysis and wheezing.   Endocrine: Negative for cold intolerance and heat intolerance.  Hematologic/Lymphatic: Does not bruise/bleed easily.  Skin: Negative for nail changes.  Musculoskeletal: Negative for muscle weakness and myalgias.  Gastrointestinal: Negative for abdominal pain, change in bowel habit, nausea and vomiting.   Neurological: Negative for difficulty with concentration, dizziness, focal weakness and headaches.  Psychiatric/Behavioral: Negative for altered mental status and suicidal ideas.  All other systems reviewed and are negative.  Objective  Blood pressure 115/77, pulse 85, temperature (!) 93.9 F (34.4 C), height 6\' 2"  (1.88 m), weight 255 lb (115.7 kg), SpO2 97 %. Body mass index is 32.74 kg/m.   Physical Exam  Constitutional: He is oriented to person, place, and time. Vital signs are normal. He appears well-developed and well-nourished.  HENT:  Head: Normocephalic and atraumatic.  Cardiovascular: Normal rate, regular rhythm, normal heart sounds and intact distal pulses.  Pulmonary/Chest: Effort normal and breath sounds normal. No accessory muscle usage. No respiratory distress.  Abdominal: Soft. Bowel sounds are normal.  Musculoskeletal:        General: Normal range of motion.     Cervical back: Normal range of motion.  Neurological: He is alert and oriented to person, place, and time.  Skin: Skin is warm and dry.  Vitals reviewed.  Radiology: No results found.  Laboratory examination:   Recent Labs    07/30/19 1145  NA 139  K 4.6  CL 104  CO2 23  GLUCOSE 92  BUN 7  CREATININE 1.24  CALCIUM 9.4  GFRNONAA 73  GFRAA 84   CMP Latest Ref Rng & Units 07/30/2019  Glucose 65 - 99 mg/dL 92  BUN 6 - 20 mg/dL 7  Creatinine 13/05/2019 - 4.16 mg/dL 6.06  Sodium 3.01 - 601 mmol/L 139  Potassium 3.5 - 5.2 mmol/L 4.6  Chloride 96 - 106 mmol/L 104  CO2 20 - 29 mmol/L 23  Calcium 8.7 - 10.2 mg/dL 9.4  Total Protein 6.0 - 8.5 g/dL 6.9  Total Bilirubin 0.0 - 1.2 mg/dL 0.4  Alkaline Phos 39 - 117 IU/L 60  AST 0 - 40 IU/L 20  ALT 0 - 44 IU/L 23   CBC Latest Ref Rng & Units 07/30/2019  WBC 3.4 - 10.8 x10E3/uL 3.7  Hemoglobin 13.0 - 17.7 g/dL 13/05/2019  Hematocrit 23.5 - 51.0 % 45.4  Platelets 150 - 450 x10E3/uL 192   Lipid Panel     Component Value Date/Time   CHOL 210 (H) 07/30/2019  1145   TRIG 92 07/30/2019 1145   HDL 56 07/30/2019 1145   CHOLHDL 3.8 07/30/2019 1145   LDLCALC 138 (H) 07/30/2019 1145   HEMOGLOBIN A1C No results found for: HGBA1C, MPG TSH No results for input(s): TSH in the last 8760 hours. Medications   Current Outpatient Medications  Medication Instructions  . amLODipine (NORVASC) 5 mg, Oral, Daily  . escitalopram (LEXAPRO) 10 MG tablet 1 tablet, Oral, Daily PRN  . ibuprofen (ADVIL) 800 MG tablet 1 tablet, Oral, As needed  . metoprolol tartrate (LOPRESSOR) 25 mg, Oral, 2 times daily  . nitroGLYCERIN (NITROSTAT) 0.4 mg, Sublingual, Every 5  min PRN  . rosuvastatin (CRESTOR) 10 mg, Oral, Daily  . zolpidem (AMBIEN) 10 MG tablet 1 tablet, Oral, At bedtime PRN    Cardiac Studies:   Coronary CTA 09/17/2019:  1. Coronary calcium score of 0. This was 0 percentile for age and sex matched control.  2. Normal coronary origin with right dominance.  3. CAD-RADS 0. No evidence of CAD (0%). Consider non-atherosclerotic causes of chest pain.  4. Moderately dilated pulmonary artery measuring 35 mm suggestive of pulmonary hypertension. There is no ASD or VSD.  5.  Mild concentric left ventricular hypertrophy.  Echocardiogram 08/01/2019: Left ventricle cavity is normal in size. Mild concentric hypertrophy of the left ventricle. Normal LV systolic function with visual EF 50-55%. Normal global wall motion. Normal diastolic filling pattern. Calculated EF 50%. Mild to moderate mitral regurgitation. Mild tricuspid regurgitation.  No evidence of pulmonary hypertension.   Assessment     ICD-10-CM   1. Essential hypertension  I10   2. Mixed hyperlipidemia  E78.2   3. Family history of heart disease  Z82.49   4. Preoperative cardiovascular examination  Z01.810     EKG 07/27/2019: Normal sinus rhythm at 64 bpm, normal axis, PRWP cannot exclude anterior infarct old. No evidence of ischemia.  Recommendations:   I discussed recent coronary CTA  results with the patient, no evidence of CAD.  Calcium score is 0.  I would recommend continued primary and secondary prevention measures.  I suspect his symptoms of chest pain were potentially related to uncontrolled hypertension.  His blood pressure is now controlled with amlodipine.  I have not refilled his metoprolol as his blood pressure has been stable without medication.  Encouraged him to continue with regular exercise and dietary modifications.  He does have hyperlipidemia and would recommend continuing with Crestor.  From cardiac standpoint, see no contraindication for proceeding with his procedure tomorrow as he is low risk for perioperative cardiovascular complications.  As his symptoms have improved and given the test result findings, I recommended that he follow-up with his PCP for continued management of hyperlipidemia and hypertension.  We will see him back on a as needed basis.  Toniann Fail, MSN, APRN, FNP-C [facility] Office: [phone] Fax: [phone]

## 2019-09-28 ENCOUNTER — Ambulatory Visit (HOSPITAL_BASED_OUTPATIENT_CLINIC_OR_DEPARTMENT_OTHER)
Admission: RE | Admit: 2019-09-28 | Discharge: 2019-09-28 | Disposition: A | Discharge status: Home / Self Care | Attending: Orthopedic Surgery | Admitting: Orthopedic Surgery

## 2019-09-28 ENCOUNTER — Other Ambulatory Visit: Payer: Self-pay

## 2019-09-28 ENCOUNTER — Encounter (HOSPITAL_BASED_OUTPATIENT_CLINIC_OR_DEPARTMENT_OTHER): Payer: Self-pay | Admitting: Orthopedic Surgery

## 2019-09-28 ENCOUNTER — Ambulatory Visit (HOSPITAL_BASED_OUTPATIENT_CLINIC_OR_DEPARTMENT_OTHER): Admitting: Physician Assistant

## 2019-09-28 ENCOUNTER — Encounter (HOSPITAL_BASED_OUTPATIENT_CLINIC_OR_DEPARTMENT_OTHER)
Admission: RE | Disposition: A | Discharge status: Home / Self Care | Payer: Self-pay | Source: Home / Self Care | Attending: Orthopedic Surgery

## 2019-09-28 DIAGNOSIS — S83282A Other tear of lateral meniscus, current injury, left knee, initial encounter: Secondary | ICD-10-CM | POA: Insufficient documentation

## 2019-09-28 DIAGNOSIS — F419 Anxiety disorder, unspecified: Secondary | ICD-10-CM | POA: Diagnosis not present

## 2019-09-28 DIAGNOSIS — Z79899 Other long term (current) drug therapy: Secondary | ICD-10-CM | POA: Insufficient documentation

## 2019-09-28 DIAGNOSIS — M2242 Chondromalacia patellae, left knee: Secondary | ICD-10-CM | POA: Insufficient documentation

## 2019-09-28 DIAGNOSIS — M659 Synovitis and tenosynovitis, unspecified: Secondary | ICD-10-CM | POA: Insufficient documentation

## 2019-09-28 DIAGNOSIS — F17290 Nicotine dependence, other tobacco product, uncomplicated: Secondary | ICD-10-CM | POA: Insufficient documentation

## 2019-09-28 DIAGNOSIS — F329 Major depressive disorder, single episode, unspecified: Secondary | ICD-10-CM | POA: Diagnosis not present

## 2019-09-28 DIAGNOSIS — I10 Essential (primary) hypertension: Secondary | ICD-10-CM | POA: Diagnosis not present

## 2019-09-28 DIAGNOSIS — X58XXXA Exposure to other specified factors, initial encounter: Secondary | ICD-10-CM | POA: Diagnosis not present

## 2019-09-28 HISTORY — DX: Essential (primary) hypertension: I10

## 2019-09-28 HISTORY — DX: Other tear of lateral meniscus, current injury, left knee, initial encounter: S83.282A

## 2019-09-28 HISTORY — DX: Angina pectoris, unspecified: I20.9

## 2019-09-28 HISTORY — PX: KNEE ARTHROSCOPY WITH LATERAL MENISECTOMY: SHX6193

## 2019-09-28 LAB — POCT I-STAT, CHEM 8
BUN: 17 mg/dL (ref 6–20)
Calcium, Ion: 1.22 mmol/L (ref 1.15–1.40)
Chloride: 104 mmol/L (ref 98–111)
Creatinine, Ser: 1.1 mg/dL (ref 0.61–1.24)
Glucose, Bld: 99 mg/dL (ref 70–99)
HCT: 51 % (ref 39.0–52.0)
Hemoglobin: 17.3 g/dL — ABNORMAL HIGH (ref 13.0–17.0)
Potassium: 3.9 mmol/L (ref 3.5–5.1)
Sodium: 139 mmol/L (ref 135–145)
TCO2: 25 mmol/L (ref 22–32)

## 2019-09-28 SURGERY — ARTHROSCOPY, KNEE, WITH LATERAL MENISCECTOMY
Anesthesia: General | Site: Knee | Laterality: Left

## 2019-09-28 MED ORDER — FENTANYL CITRATE (PF) 100 MCG/2ML IJ SOLN
INTRAMUSCULAR | Status: AC
  Filled 2019-09-28: qty 2

## 2019-09-28 MED ORDER — OXYCODONE HCL 5 MG PO TABS
ORAL_TABLET | ORAL | Status: AC
  Filled 2019-09-28: qty 1

## 2019-09-28 MED ORDER — ONDANSETRON 4 MG PO TBDP: 4.0000 mg | ORAL_TABLET | Freq: Three times a day (TID) | ORAL | 0 refills | Status: AC | PRN

## 2019-09-28 MED ORDER — CEFAZOLIN SODIUM-DEXTROSE 2-4 GM/100ML-% IV SOLN
2.0000 g | INTRAVENOUS | Status: AC
  Administered 2019-09-28: 12:00:00 2 g via INTRAVENOUS
  Filled 2019-09-28: qty 100

## 2019-09-28 MED ORDER — LIDOCAINE HCL (CARDIAC) PF 100 MG/5ML IV SOSY
PREFILLED_SYRINGE | INTRAVENOUS | Status: DC | PRN
  Administered 2019-09-28: 100 mg via INTRAVENOUS

## 2019-09-28 MED ORDER — DEXAMETHASONE SODIUM PHOSPHATE 10 MG/ML IJ SOLN
INTRAMUSCULAR | Status: DC | PRN
  Administered 2019-09-28: 10 mg via INTRAVENOUS

## 2019-09-28 MED ORDER — ONDANSETRON HCL 4 MG/2ML IJ SOLN
INTRAMUSCULAR | Status: DC | PRN
  Administered 2019-09-28: 4 mg via INTRAVENOUS

## 2019-09-28 MED ORDER — MIDAZOLAM HCL 5 MG/5ML IJ SOLN
INTRAMUSCULAR | Status: DC | PRN
  Administered 2019-09-28: 2 mg via INTRAVENOUS

## 2019-09-28 MED ORDER — LACTATED RINGERS IV SOLN
INTRAVENOUS | Status: DC
  Filled 2019-09-28: qty 1000

## 2019-09-28 MED ORDER — FENTANYL CITRATE (PF) 100 MCG/2ML IJ SOLN
INTRAMUSCULAR | Status: DC | PRN
  Administered 2019-09-28 (×4): 50 ug via INTRAVENOUS

## 2019-09-28 MED ORDER — SODIUM CHLORIDE 0.9 % IR SOLN
Status: DC | PRN
  Administered 2019-09-28: 6000 mL

## 2019-09-28 MED ORDER — ONDANSETRON HCL 4 MG/2ML IJ SOLN
4.0000 mg | Freq: Once | INTRAMUSCULAR | Status: DC | PRN
  Filled 2019-09-28: qty 2

## 2019-09-28 MED ORDER — HYDROCODONE-ACETAMINOPHEN 5-325 MG PO TABS: 1.0000 | ORAL_TABLET | ORAL | 0 refills | Status: AC | PRN

## 2019-09-28 MED ORDER — KETOROLAC TROMETHAMINE 30 MG/ML IJ SOLN
INTRAMUSCULAR | Status: DC | PRN
  Administered 2019-09-28: 30 mg via INTRAVENOUS

## 2019-09-28 MED ORDER — OXYCODONE HCL 5 MG/5ML PO SOLN
5.0000 mg | Freq: Once | ORAL | Status: AC | PRN
  Filled 2019-09-28: qty 5

## 2019-09-28 MED ORDER — OXYCODONE HCL 5 MG PO TABS
5.0000 mg | ORAL_TABLET | Freq: Once | ORAL | Status: AC | PRN
  Administered 2019-09-28: 5 mg via ORAL
  Filled 2019-09-28: qty 1

## 2019-09-28 MED ORDER — PROPOFOL 10 MG/ML IV BOLUS
INTRAVENOUS | Status: DC | PRN
  Administered 2019-09-28: 200 mg via INTRAVENOUS

## 2019-09-28 MED ORDER — CHLORHEXIDINE GLUCONATE 4 % EX LIQD
60.0000 mL | Freq: Once | CUTANEOUS | Status: DC
  Filled 2019-09-28: qty 118

## 2019-09-28 MED ORDER — CEFAZOLIN SODIUM-DEXTROSE 2-4 GM/100ML-% IV SOLN
INTRAVENOUS | Status: AC
  Filled 2019-09-28: qty 100

## 2019-09-28 MED ORDER — FENTANYL CITRATE (PF) 100 MCG/2ML IJ SOLN
25.0000 ug | INTRAMUSCULAR | Status: DC | PRN
  Filled 2019-09-28: qty 1

## 2019-09-28 MED ORDER — BUPIVACAINE HCL 0.25 % IJ SOLN
INTRAMUSCULAR | Status: DC | PRN
  Administered 2019-09-28: 20 mL

## 2019-09-28 SURGICAL SUPPLY — 33 items
ABLATOR ASPIRATE 50D MULTI-PRT (SURGICAL WAND) IMPLANT
BANDAGE ELASTIC 6 VELCRO ST LF (GAUZE/BANDAGES/DRESSINGS) ×3 IMPLANT
BANDAGE ESMARK 6X9 LF (GAUZE/BANDAGES/DRESSINGS) IMPLANT
BLADE SHAVER TORPEDO 4X13 (MISCELLANEOUS) ×3 IMPLANT
BNDG ESMARK 6X9 LF (GAUZE/BANDAGES/DRESSINGS)
COVER WAND RF STERILE (DRAPES) ×3 IMPLANT
CUFF TOURN SGL QUICK 34 (TOURNIQUET CUFF) ×2
CUFF TRNQT CYL 34X4.125X (TOURNIQUET CUFF) ×1 IMPLANT
DRAPE ARTHROSCOPY W/POUCH 114 (DRAPES) ×3 IMPLANT
DRAPE U-SHAPE 47X51 STRL (DRAPES) ×3 IMPLANT
DRSG PAD ABDOMINAL 8X10 ST (GAUZE/BANDAGES/DRESSINGS) ×3 IMPLANT
DURAPREP 26ML APPLICATOR (WOUND CARE) ×3 IMPLANT
GAUZE SPONGE 4X4 12PLY STRL (GAUZE/BANDAGES/DRESSINGS) ×3 IMPLANT
GAUZE XEROFORM 1X8 LF (GAUZE/BANDAGES/DRESSINGS) ×3 IMPLANT
GLOVE BIO SURGEON STRL SZ7.5 (GLOVE) ×3 IMPLANT
GLOVE BIOGEL PI IND STRL 8 (GLOVE) ×1 IMPLANT
GLOVE BIOGEL PI INDICATOR 8 (GLOVE) ×2
GOWN STRL REUS W/TWL XL LVL3 (GOWN DISPOSABLE) IMPLANT
IV NS IRRIG 3000ML ARTHROMATIC (IV SOLUTION) ×6 IMPLANT
KIT TURNOVER CYSTO (KITS) ×3 IMPLANT
KNEE WRAP E Z 3 GEL PACK (MISCELLANEOUS) ×3 IMPLANT
MANIFOLD NEPTUNE II (INSTRUMENTS) ×3 IMPLANT
PACK ARTHROSCOPY DSU (CUSTOM PROCEDURE TRAY) ×3 IMPLANT
PACK BASIN DAY SURGERY FS (CUSTOM PROCEDURE TRAY) ×3 IMPLANT
PAD ARMBOARD 7.5X6 YLW CONV (MISCELLANEOUS) IMPLANT
PORT APPOLLO RF 90DEGREE MULTI (SURGICAL WAND) IMPLANT
SUT ETHILON 4 0 PS 2 18 (SUTURE) ×3 IMPLANT
SYR CONTROL 10ML LL (SYRINGE) ×3 IMPLANT
TOWEL OR 17X26 10 PK STRL BLUE (TOWEL DISPOSABLE) ×3 IMPLANT
TUBE CONNECTING 12'X1/4 (SUCTIONS) ×2
TUBE CONNECTING 12X1/4 (SUCTIONS) ×4 IMPLANT
TUBING ARTHROSCOPY IRRIG 16FT (MISCELLANEOUS) ×3 IMPLANT
WATER STERILE IRR 500ML POUR (IV SOLUTION) ×3 IMPLANT

## 2019-09-28 NOTE — Transfer of Care (Signed)
Immediate Anesthesia Transfer of Care Note  Patient: Nathan Rosales  Procedure(s) Performed: Procedure(s) (LRB): Left knee arthroscopic partial lateral meniscectomy with patella chondroplasty (Left)  Patient Location: PACU  Anesthesia Type: General  Level of Consciousness: awake, sedated, patient cooperative and responds to stimulation  Airway & Oxygen Therapy: Patient Spontanous Breathing and Patient connected to  02 and soft FM   Post-op Assessment: Report given to PACU RN, Post -op Vital signs reviewed and stable and Patient moving all extremities  Post vital signs: Reviewed and stable  Complications: No apparent anesthesia complications

## 2019-09-28 NOTE — Anesthesia Postprocedure Evaluation (Signed)
Anesthesia Post Note  Patient: Nathan Rosales  Procedure(s) Performed: Left knee arthroscopic partial lateral meniscectomy with patella chondroplasty (Left Knee)     Patient location during evaluation: PACU Anesthesia Type: General Level of consciousness: awake and alert Pain management: pain level controlled Vital Signs Assessment: post-procedure vital signs reviewed and stable Respiratory status: spontaneous breathing, nonlabored ventilation and respiratory function stable Cardiovascular status: blood pressure returned to baseline and stable Postop Assessment: no apparent nausea or vomiting Anesthetic complications: no    Last Vitals:  Vitals:   09/28/19 1315 09/28/19 1330  BP: (!) 133/97 131/86  Pulse: 68 64  Resp: 13 14  Temp:    SpO2: 95% 91%    Last Pain:  Vitals:   09/28/19 1330  TempSrc:   PainSc: 0-No pain                 Lucretia Kern

## 2019-09-28 NOTE — H&P (Signed)
ORTHOPAEDIC H and P  REQUESTING PHYSICIAN: Nicholes Stairs, MD  PCP:  Lin Landsman, MD  Chief Complaint: Left knee pain  HPI: Nathan Rosales is a 39 y.o. male who complains of chronic left knee pain and mechanical symptoms.  He has had conservative treatment with intra-articular injection as well as activity modification and oral anti-inflammatories.  However, he continues with laterally based pain consistent with his meniscus pathology as well as some anterior pain consistent with chondral flap on the patella surface.  He is here today for surgery.  No new complaints.  Past Medical History:  Diagnosis Date  . Acute lateral meniscus tear of left knee   . Anginal pain ([place])   . Anxiety   . Depression   . Hypertension    Past Surgical History:  Procedure Laterality Date  . WRIST SURGERY Right 2015   Social History   Socioeconomic History  . Marital status: Divorced    Spouse name: Not on file  . Number of children: 2  . Years of education: Not on file  . Highest education level: Not on file  Occupational History  . Not on file  Tobacco Use  . Smoking status: Current Some Day Smoker    Types: Cigars  . Smokeless tobacco: Never Used  Substance and Sexual Activity  . Alcohol use: Yes    Comment: occ  . Drug use: Never  . Sexual activity: Not on file  Other Topics Concern  . Not on file  Social History Narrative  . Not on file   Social Determinants of Health   Financial Resource Strain:   . Difficulty of Paying Living Expenses: Not on file  Food Insecurity:   . Worried About Charity fundraiser in the Last Year: Not on file  . Ran Out of Food in the Last Year: Not on file  Transportation Needs:   . Lack of Transportation (Medical): Not on file  . Lack of Transportation (Non-Medical): Not on file  Physical Activity:   . Days of Exercise per Week: Not on file  . Minutes of Exercise per Session: Not on file  Stress:   . Feeling of Stress : Not on file   Social Connections:   . Frequency of Communication with Friends and Family: Not on file  . Frequency of Social Gatherings with Friends and Family: Not on file  . Attends Religious Services: Not on file  . Active Member of Clubs or Organizations: Not on file  . Attends Archivist Meetings: Not on file  . Marital Status: Not on file   Family History  Problem Relation Age of Onset  . Hypertension Mother   . Heart failure Father    No Known Allergies Prior to Admission medications   Medication Sig Start Date End Date Taking? Authorizing Provider  amLODipine (NORVASC) 5 MG tablet Take 1 tablet (5 mg total) by mouth daily. 07/27/19 10/25/19 Yes Miquel Dunn, NP  ibuprofen (ADVIL) 800 MG tablet Take 1 tablet by mouth as needed.   Yes [provider]  metoprolol tartrate (LOPRESSOR) 25 MG tablet Take 1 tablet (25 mg total) by mouth 2 (two) times daily. 08/23/19 11/21/19 Yes Miquel Dunn, NP  zolpidem (AMBIEN) 10 MG tablet Take 1 tablet by mouth at bedtime as needed.    Yes [provider]  escitalopram (LEXAPRO) 10 MG tablet Take 1 tablet by mouth daily as needed.    [provider]  nitroGLYCERIN (NITROSTAT) 0.4 MG SL  tablet Place 1 tablet (0.4 mg total) under the tongue every 5 (five) minutes as needed for chest pain. 09/27/19 12/26/19  Toniann Fail, NP  rosuvastatin (CRESTOR) 10 MG tablet Take 1 tablet (10 mg total) by mouth daily. 09/18/19 12/17/19  Yates Decamp, MD   No results found.  Positive ROS: All other systems have been reviewed and were otherwise negative with the exception of those mentioned in the HPI and as above.  Physical Exam: General: Alert, no acute distress Cardiovascular: No pedal edema Respiratory: No cyanosis, no use of accessory musculature GI: No organomegaly, abdomen is soft and non-tender Skin: No lesions in the area of chief complaint Neurologic: Sensation intact distally Psychiatric: Patient is competent  for consent with normal mood and affect Lymphatic: No axillary or cervical lymphadenopathy  MUSCULOSKELETAL:  Left lower extremity is clean, dry, and intact.  Neurovascularly intact.  Assessment: 1.  Left knee lateral meniscus tear, acute.  2.  Left knee patella chondromalacia.  Plan: -Our plan is for arthroscopic intervention today of the left knee.  We again reviewed and discussed the risk and benefits of this procedure at length.  Questions were solicited and answered to his satisfaction.  He has provided informed consent to proceed. -Plan for discharge home postoperatively from PACU.    Yolonda Kida, MD Cell [phone]    09/28/2019 12:09 PM

## 2019-09-28 NOTE — Anesthesia Procedure Notes (Signed)
Procedure Name: LMA Insertion Date/Time: 09/28/2019 12:20 PM Performed by: Jessica Priest, CRNA Pre-anesthesia Checklist: Patient identified, Emergency Drugs available, Suction available and Patient being monitored Patient Re-evaluated:Patient Re-evaluated prior to induction Oxygen Delivery Method: Circle system utilized Preoxygenation: Pre-oxygenation with 100% oxygen Induction Type: IV induction Ventilation: Mask ventilation without difficulty LMA: LMA inserted LMA Size: 5.0 Number of attempts: 1 Airway Equipment and Method: Bite block Placement Confirmation: positive ETCO2 and breath sounds checked- equal and bilateral Tube secured with: Tape Dental Injury: Teeth and Oropharynx as per pre-operative assessment

## 2019-09-28 NOTE — Op Note (Signed)
Surgery Date: 09/28/2019  Surgeon(s): Nicholes Stairs, MD  ANESTHESIA:  general  FLUIDS: Per anesthesia record.   ESTIMATED BLOOD LOSS: minimal  PREOPERATIVE DIAGNOSES:  1. Left knee lateral meniscus tear 2. Left  knee synovitis 3.  Left knee patella chondromalacia  POSTOPERATIVE DIAGNOSES:  1. Left knee lateral meniscus tear 2. Left  knee synovitis 3.  Left knee patella chondromalacia 4.  Left knee lateral shelf plica  PROCEDURES PERFORMED:  1. Left knee arthroscopy with limited synovectomy (Plica resection) 2. Left knee arthroscopy with arthroscopic partial lateral meniscectomy 3. knee arthroscopy with arthroscopic chondroplasty of medial facet patella  DESCRIPTION OF PROCEDURE: Nathan Rosales is a 39 y.o.-year-old male with left knee lateral meniscus tear, and symptomatic left patella chondromalacia. Plans are to proceed with partial lateral meniscectomy and diagnostic arthroscopy with debridement as indicated. Full discussion held regarding risks benefits alternatives and complications related surgical intervention. Conservative care options reviewed. All questions answered.  The patient was identified in the preoperative holding area and the operative extremity was marked. The patient was brought to the operating room and transferred to operating table in a supine position. Satisfactory general anesthesia was induced by anesthesiology.    Standard anterolateral, anteromedial arthroscopy portals were obtained. The anteromedial portal was obtained with a spinal needle for localization under direct visualization with subsequent diagnostic findings.   Anteromedial and anterolateral chambers: moderate synovitis. The synovitis was debrided with a 4.5 mm full radius shaver through both the anteromedial and lateral portals.   Suprapatellar pouch and gutters: cartilage synovitis or debris. Patella chondral surface: Grade 3, medial facet, lateral facet grade 0 Trochlear chondral  surface: Grade 0 Patellofemoral tracking: slight lateral tilt Medial meniscus: intact.  Medial femoral condyle flexion bearing surface: Grade 0 Medial femoral condyle extension bearing surface: Grade 0 Medial tibial plateau: Grade 0 Anterior cruciate ligament:stable Posterior cruciate ligament:stable Lateral meniscus: free edge tear posterior horn and mid- body.   Lateral femoral condyle flexion bearing surface: Grade 0 Lateral femoral condyle extension bearing surface: Grade 0 Lateral tibial plateau: Grade 0  We began with the partial lateral meniscectomy.  Utilizing the motorized shaver and baskets biters we debrided the free edge flap tears of the mid body and posterior horn lateral meniscus.  All stable meniscus was left behind.  Next, we performed chondroplasty of grade II and III chondromalacia of the medial patella facet.  This was accomplished while viewing the lateral portal and working from the medial portal.  We used a debridement technique with motorized shaver to debride any flaps.  We did also lavaged the joint copiously and remove multiple free-floating chondral fragments.  Lastly, we performed a lateral shelf plica resection.  While viewing from the medial portal and working from the lateral portal we used a motorized shaver to remove a significant band of lateral shelf plica.  This was clearly abrading with the lateral femoral condyle as well.  Dynamic examination following removal of the plica demonstrated no residual friction.    After completion of synovectomy, diagnostic exam, and debridements as described, all compartments were checked and no residual debris remained. Hemostasis was achieved with the cautery wand. The portals were approximated with buried monocryl. All excess fluid was expressed from the joint.  Xeroform sterile gauze dressings were applied followed by Ace bandage and ice pack.   DISPOSITION: The patient was awakened from general anesthetic, extubated,  taken to the recovery room in medically stable condition, no apparent complications. The patient may be weightbearing as tolerated to the operative  lower extremity.  Range of motion of right knee as tolerated.

## 2019-09-28 NOTE — Brief Op Note (Signed)
09/28/2019  12:56 PM  PATIENT:  Nathan Rosales  39 y.o. male  PRE-OPERATIVE DIAGNOSIS:  Left knee lateral meniscus tear with patella chondromalacia  POST-OPERATIVE DIAGNOSIS:  Left knee lateral meniscus tear with patella chondromalacia  PROCEDURE:  Procedure(s) with comments: Left knee arthroscopic partial lateral meniscectomy with patella chondroplasty (Left) - 60 mins  SURGEON:  Surgeon(s) and Role:    * Yolonda Kida, MD - Primary  PHYSICIAN ASSISTANT:   ASSISTANTS: none   ANESTHESIA:   local and general  EBL:  5 cc  BLOOD ADMINISTERED:none  DRAINS: none   LOCAL MEDICATIONS USED:  MARCAINE     SPECIMEN:  No Specimen  DISPOSITION OF SPECIMEN:  N/A  COUNTS:  YES  TOURNIQUET:   Total Tourniquet Time Documented: Thigh (Left) - 16 minutes Total: Thigh (Left) - 16 minutes   DICTATION: .Note written in EPIC  PLAN OF CARE: Discharge to home after PACU  PATIENT DISPOSITION:  PACU - hemodynamically stable.   Delay start of Pharmacological VTE agent (>24hrs) due to surgical blood loss or risk of bleeding: not applicable

## 2019-09-28 NOTE — Discharge Instructions (Signed)
-Maintain postoperative bandage for 3 days.  You may remove that bandage on Monday morning and begin showering at that time.  Please do not submerge underwater.  Maintain incisions clean and dry in between bathing.  -Okay for full weightbearing as tolerated to the left leg.  You should begin early range of motion as well.  Move the knee often.  She also pump your ankle up and down throughout the day to help prevent blood clots.  -For mild to moderate pain use ice and Advil around-the-clock.  For breakthrough pain use Norco as directed.  -For the prevention of blood clots use 81 mg aspirin once per day for 6 weeks.  PAIN You will be expected to have a moderate amount of pain in the affected knee for approximately two weeks.  However, the first two to four days will be the most severe in terms of the pain you will experience.  Prescriptions have been provided for you to take as needed for the pain.  The pain can be markedly reduced by using the ice/compressive bandage given.  Exchange the ice packs whenever they thaw.  During the night, keep the bandage on because it will still provide some compression for the swelling.  Also, keep the leg elevated on pillows above your heart, and this will help alleviate the pain and swelling.  MEDICATION Prescriptions have been provided to take as needed for pain.   ACTIVITY .  Remember that the swelling may still increase after three to four days if you are up and doing too much.  You may put as much weight on the affected leg as pain will allow.  Use your crutches for comfort and safety.  However, as soon as you are able, you may discard the crutches and go without them.   DRESSING Keep the current dressing as dry as possible.  Three days after your surgery, you may remove the ice/compressive wrap, and surgical dressing.  You may now take a shower, but do not scrub the sounds directly with soap.  Let water rinse over these and gently wipe with your hand.  Reapply  band-aids over the puncture wounds and more gauze if needed.  A slight amount of thin drainage can be normal at this time, and do not let it frighten you.  Reapply the ice/compressive wrap.  You may now repeat this every day each time you shower.  SYMPTOMS TO REPORT TO YOUR DOCTOR  -Extreme pain.  -Extreme swelling.  -Temperature above 101 degrees that does not come down with acetaminophen     (Tylenol).  -Any changes in the feeling, color or movement of your toes.  -Extreme redness, heat, swelling or drainage at your incision   -Return to see Dr. Aundria Rud in 2 weeks for routine postop wound check and suture removal.   Post Anesthesia Home Care Instructions  Activity: Get plenty of rest for the remainder of the day. A responsible individual must stay with you for 24 hours following the procedure.  For the next 24 hours, DO NOT: -Drive a car -Advertising copywriter -Drink alcoholic beverages -Take any medication unless instructed by your physician -Make any legal decisions or sign important papers.  Meals: Start with liquid foods such as gelatin or soup. Progress to regular foods as tolerated. Avoid greasy, spicy, heavy foods. If nausea and/or vomiting occur, drink only clear liquids until the nausea and/or vomiting subsides. Call your physician if vomiting continues.  Special Instructions/Symptoms: Your throat may feel dry or sore from the  anesthesia or the breathing tube placed in your throat during surgery. If this causes discomfort, gargle with warm salt water. The discomfort should disappear within 24 hours.

## 2019-10-07 ENCOUNTER — Other Ambulatory Visit: Payer: Self-pay | Admitting: Cardiology

## 2019-11-05 ENCOUNTER — Other Ambulatory Visit: Payer: Self-pay | Admitting: Cardiology

## 2019-11-13 ENCOUNTER — Ambulatory Visit: Attending: Internal Medicine

## 2019-11-13 DIAGNOSIS — Z20822 Contact with and (suspected) exposure to covid-19: Secondary | ICD-10-CM | POA: Insufficient documentation

## 2019-11-14 LAB — NOVEL CORONAVIRUS, NAA: SARS-CoV-2, NAA: NOT DETECTED

## 2019-11-16 ENCOUNTER — Ambulatory Visit: Payer: Self-pay | Attending: Internal Medicine

## 2019-11-16 DIAGNOSIS — Z20822 Contact with and (suspected) exposure to covid-19: Secondary | ICD-10-CM

## 2019-11-17 LAB — NOVEL CORONAVIRUS, NAA: SARS-CoV-2, NAA: NOT DETECTED

## 2020-01-01 ENCOUNTER — Other Ambulatory Visit: Payer: Self-pay | Admitting: Cardiology

## 2020-01-13 ENCOUNTER — Other Ambulatory Visit: Payer: Self-pay | Admitting: Cardiology

## 2020-01-13 DIAGNOSIS — I209 Angina pectoris, unspecified: Secondary | ICD-10-CM

## 2020-03-02 IMAGING — CT CT HEART MORP W/ CTA COR W/ SCORE W/ CA W/CM &/OR W/O CM
4 of 7 series · 8 of 20 positions shown, 9 images · IV contrast (APPLIED)
Comparison: none

CLINICAL DATA: 38-year-old male with h/o hypertension, significant
FH of early CAD and mildly decreased LVEF.

EXAM:
Cardiac/Coronary  CTA
TECHNIQUE: The patient was scanned on a [employer] Force scanner.

[Series 6: best diast 75 % · axial · 0.39mm/px · z∈[+1183,+1230]mm · 2 of 353 slices shown, 3 images]
[im 118/353  vessel]
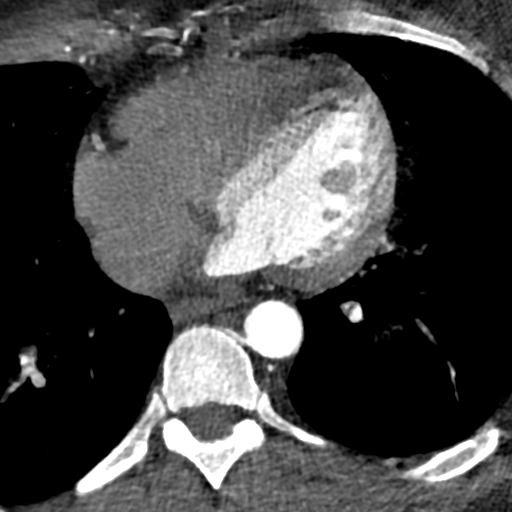
[im 118/353  lung]
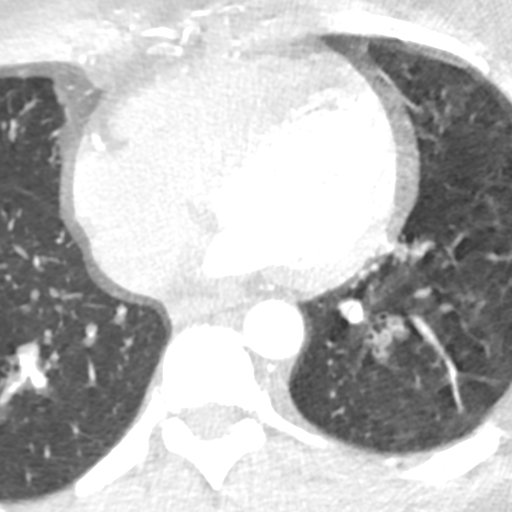
[im 235/353  vessel]
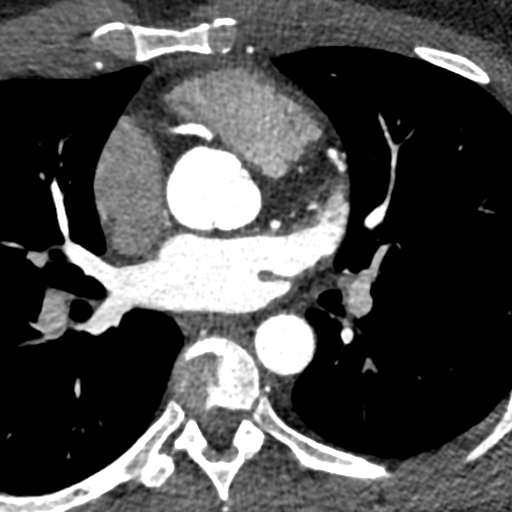

[Series 7: best syst 36 % · axial · 0.39mm/px · z∈[+1183,+1230]mm · 2 of 353 slices shown]
[im 118/353  vessel]
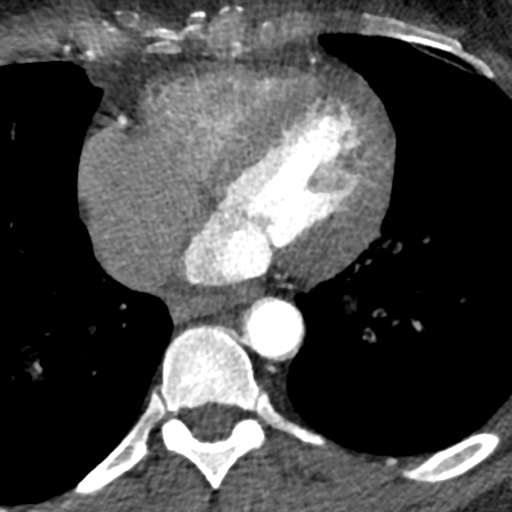
[im 235/353  vessel]
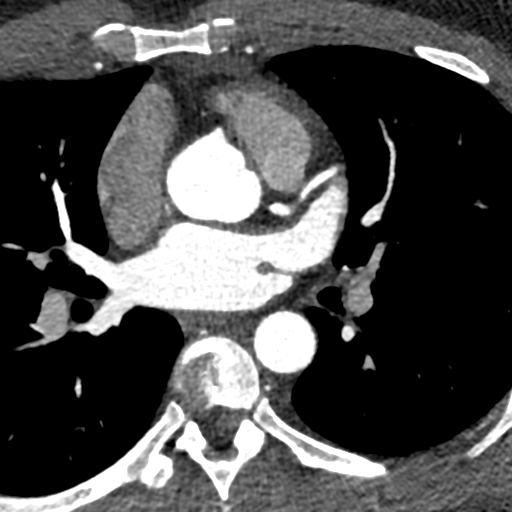

[Series 8: ts diast sharp 36 % · axial · 0.39mm/px · z∈[+1183,+1230]mm · 2 of 353 slices shown]
[im 118/353  lung]
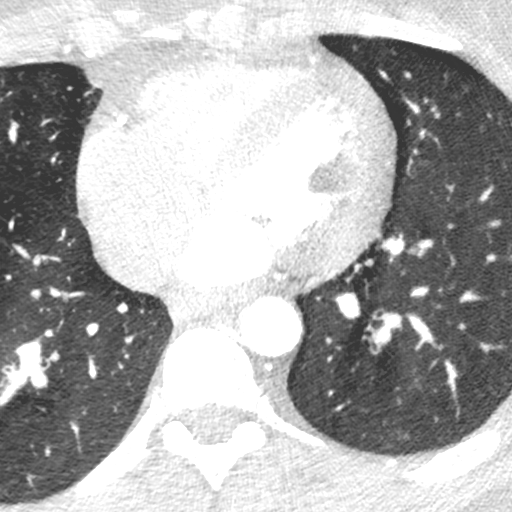
[im 235/353  lung]
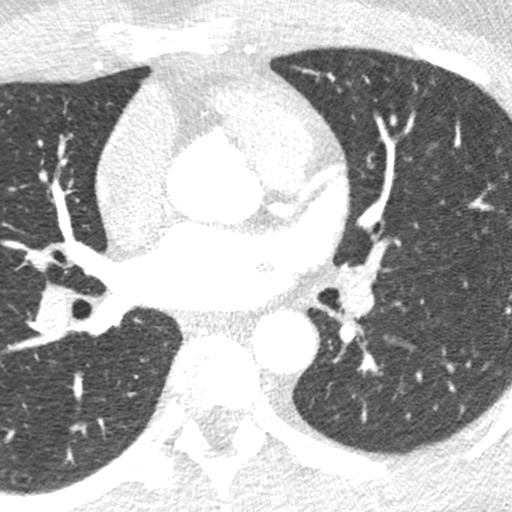

[Series 9: ts syst sharp 36 % · axial · 0.39mm/px · z∈[+1183,+1230]mm · 2 of 353 slices shown]
[im 118/353  lung]
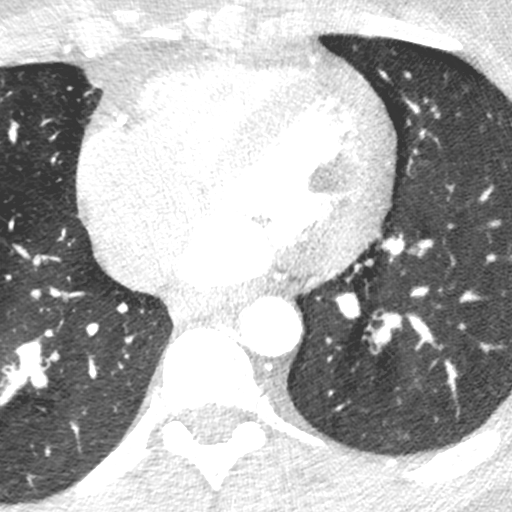
[im 235/353  lung]
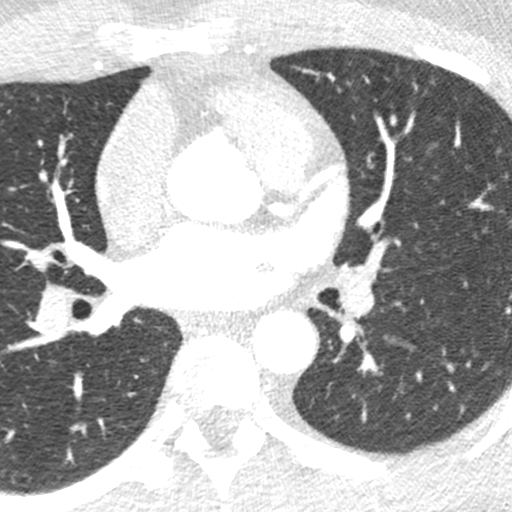

[8 of 20 positions shown; findings below may reference images not displayed]



Aorta:  Normal size.  No calcifications.  No dissection.

Aortic Valve:  Trileaflet.  No calcifications.

Coronary Arteries:  Normal coronary origin.  Right dominance.

RCA is a large dominant artery that gives rise to PDA and PLA. There
is no plaque.

Left main is a large artery that gives rise to LAD and LCX arteries.
Left main has no plaque.

LAD is a large vessel that gives rise to one large diagonal artery
and has no plaque.

LCX is a non-dominant artery that has no plaque.

Other findings:

Normal pulmonary vein drainage into the left atrium.

Normal left atrial appendage without a thrombus.
IMPRESSION: 1. Coronary calcium score of 0. This was 0 percentile for age and
sex matched control.

2. Normal coronary origin with right dominance.

3. CAD-RADS 0. No evidence of CAD (0%). Consider non-atherosclerotic
causes of chest pain.

4. Moderately dilated pulmonary artery measuring 35 mm suggestive of
pulmonary hypertension. There is no ASD or VSD.

5.  Mild concentric left ventricular hypertrophy.

## 2020-03-18 ENCOUNTER — Other Ambulatory Visit: Payer: Self-pay | Admitting: Cardiology

## 2020-04-01 ENCOUNTER — Other Ambulatory Visit: Payer: Self-pay

## 2020-04-01 ENCOUNTER — Other Ambulatory Visit: Payer: Self-pay | Admitting: Family Medicine

## 2020-04-01 ENCOUNTER — Ambulatory Visit
Admission: RE | Admit: 2020-04-01 | Discharge: 2020-04-01 | Disposition: A | Discharge status: Home / Self Care | Source: Ambulatory Visit | Attending: Family Medicine | Admitting: Family Medicine

## 2020-04-01 DIAGNOSIS — T1490XA Injury, unspecified, initial encounter: Secondary | ICD-10-CM

## 2020-04-01 DIAGNOSIS — R0602 Shortness of breath: Secondary | ICD-10-CM

## 2020-04-01 DIAGNOSIS — M79641 Pain in right hand: Secondary | ICD-10-CM

## 2020-09-10 ENCOUNTER — Other Ambulatory Visit: Payer: Self-pay | Admitting: Cardiology
